# Patient Record
Sex: Female | Born: 1995
Health system: Southern US, Community
[De-identification: ages and names within clinical notes are randomized; demographics above are authoritative.]

## PROBLEM LIST (undated history)

## (undated) DIAGNOSIS — G5600 Carpal tunnel syndrome, unspecified upper limb: Secondary | ICD-10-CM

## (undated) HISTORY — DX: Carpal tunnel syndrome, unspecified upper limb: G56.00

---

## 2019-08-21 ENCOUNTER — Other Ambulatory Visit: Payer: Self-pay

## 2019-08-22 ENCOUNTER — Other Ambulatory Visit: Payer: Self-pay

## 2019-08-22 ENCOUNTER — Ambulatory Visit (HOSPITAL_BASED_OUTPATIENT_CLINIC_OR_DEPARTMENT_OTHER)
Admission: RE | Admit: 2019-08-22 | Discharge: 2019-08-22 | Disposition: A | Discharge status: Home / Self Care | Payer: 59 | Source: Ambulatory Visit | Attending: Medical | Admitting: Medical

## 2019-08-22 ENCOUNTER — Ambulatory Visit: Payer: 59 | Admitting: Medical

## 2019-08-22 ENCOUNTER — Encounter: Payer: Self-pay | Admitting: Medical

## 2019-08-22 VITALS — BP 122/70 | HR 90 | Temp 98.0°F | Resp 18 | Ht 65.0 in | Wt 183.8 lb

## 2019-08-22 DIAGNOSIS — M25539 Pain in unspecified wrist: Secondary | ICD-10-CM | POA: Insufficient documentation

## 2019-08-22 MED ORDER — DICLOFENAC SODIUM 75 MG PO TBEC: 75.0000 mg | DELAYED_RELEASE_TABLET | Freq: Two times a day (BID) | ORAL | 0 refills | Status: DC

## 2019-08-22 NOTE — Progress Notes (Signed)
Subjective:    Patient ID: Elizabeth Liu, female    DOB: [birth year], 24 y.o.   MRN: [id number]  HPI   Pt in for first time.   Pt works at [employer]. Pt is preschool Primary school teacher. Pt states no exercise regularly. Pt states moderate healthy. She has been eating more fruits. Nonsmoker, no alcohol use. Married- one child 53 years old.  Pt states she has had some pains in hand/wrist. Pt states 2 months ago or so went to UC and they told her probable carpel tunnel syndrome. Pt has had on and off pain for 5-6 years. Most recent some pain more over past month. Pain 2 days out of week. More pain when she types or does fine motor skill work with preschoolers.Pt does not have any tingling or numbess to hand. Pt describes pain more ventral surface of wrist when has pain. More on left side. No neck pain.   No diffuse arthralgias.   At urgent care gave a medications that she took for a week.   [facility]  LMP- Jul 29, 2019   Review of Systems  Constitutional: Negative for chills, fatigue and fever.  Respiratory: Negative for cough, chest tightness, shortness of breath and wheezing.   Cardiovascular: Negative for chest pain and palpitations.  Gastrointestinal: Negative for abdominal pain.  Musculoskeletal:       Bilateral wrist pains.  Skin: Negative for rash.  Neurological: Negative for dizziness, syncope and headaches.  Hematological: Negative for adenopathy. Does not bruise/bleed easily.  Psychiatric/Behavioral: Negative for behavioral problems, decreased concentration, dysphoric mood, sleep disturbance and suicidal ideas. The patient is not nervous/anxious.     Past Medical History:  Diagnosis Date  . Carpal tunnel syndrome      Social History   Socioeconomic History  . Marital status: Married    Spouse name: Not on file  . Number of children: Not on file  . Years of education: Not on file  . Highest education level: Not on file  Occupational History   . Not on file  Tobacco Use  . Smoking status: Never Smoker  . Smokeless tobacco: Never Used  Substance and Sexual Activity  . Alcohol use: Never  . Drug use: Never  . Sexual activity: Yes    Birth control/protection: None  Other Topics Concern  . Not on file  Social History Narrative  . Not on file   Social Determinants of Health   Financial Resource Strain:   . Difficulty of Paying Living Expenses: Not on file  Food Insecurity:   . Worried About Programme researcher, broadcasting/film/video in the Last Year: Not on file  . Ran Out of Food in the Last Year: Not on file  Transportation Needs:   . Lack of Transportation (Medical): Not on file  . Lack of Transportation (Non-Medical): Not on file  Physical Activity:   . Days of Exercise per Week: Not on file  . Minutes of Exercise per Session: Not on file  Stress:   . Feeling of Stress : Not on file  Social Connections:   . Frequency of Communication with Friends and Family: Not on file  . Frequency of Social Gatherings with Friends and Family: Not on file  . Attends Religious Services: Not on file  . Active Member of Clubs or Organizations: Not on file  . Attends Banker Meetings: Not on file  . Marital Status: Not on file  Intimate Partner Violence:   .  Fear of Current or Ex-Partner: Not on file  . Emotionally Abused: Not on file  . Physically Abused: Not on file  . Sexually Abused: Not on file    History reviewed. No pertinent surgical history.  Family History  Problem Relation Age of Onset  . Diabetes Mother   . Cancer Paternal Grandmother     Not on File  No current outpatient medications on file prior to visit.   No current facility-administered medications on file prior to visit.    BP 122/70 (BP Location: Left Arm, Patient Position: Sitting, Cuff Size: Normal)   Pulse 90   Temp 98 F (36.7 C) (Temporal)   Resp 18   Ht 5\' 5"  (1.651 m)   Wt 183 lb 12.8 oz (83.4 kg)   LMP 07/29/2019 (Approximate)   SpO2 99%    BMI 30.59 kg/m       Objective:   Physical Exam  General- No acute distress. Pleasant patient. Neck- Full range of motion, no jvd Lungs- Clear, even and unlabored. Heart- regular rate and rhythm. Neurologic- CNII- XII grossly intact. Upper ext no swelling of either wrist. No pain on palpation. Good rom. Normal grip strength. Negative phalens sign.        Assessment & Plan:    Your bilateral wrist pain will order xray of left side since this is side that is worse.  Possible CTS but not clear as no numbness or tingling. Rx diclofenac and use wrist cock up splint at least every night.  Give me update on how you are doing in 10 days. My chart would work. Then will decide if need to refer to hand  Specialist or sports medicine.  30 minutes spent with new pt.   Mackie Pai, PA-C

## 2019-08-22 NOTE — Patient Instructions (Signed)
Your bilateral wrist pain will order xray of left side since this is side that is worse.  Possible CTS but not clear as no numbness or tingling. Rx diclofenac and use wrist cock up splint at least every night.  Give me update on how you are doing in 10 days. My chart would work. Then will decide if need to refer to hand  Specialist or sports medicine.

## 2020-02-15 ENCOUNTER — Emergency Department (HOSPITAL_BASED_OUTPATIENT_CLINIC_OR_DEPARTMENT_OTHER): Payer: 59

## 2020-02-15 ENCOUNTER — Emergency Department (HOSPITAL_BASED_OUTPATIENT_CLINIC_OR_DEPARTMENT_OTHER)
Admission: EM | Admit: 2020-02-15 | Discharge: 2020-02-15 | Disposition: A | Discharge status: Home / Self Care | Payer: 59 | Attending: Emergency Medicine | Admitting: Emergency Medicine

## 2020-02-15 ENCOUNTER — Encounter (HOSPITAL_BASED_OUTPATIENT_CLINIC_OR_DEPARTMENT_OTHER): Payer: Self-pay | Admitting: *Deleted

## 2020-02-15 DIAGNOSIS — R531 Weakness: Secondary | ICD-10-CM | POA: Insufficient documentation

## 2020-02-15 DIAGNOSIS — Z20822 Contact with and (suspected) exposure to covid-19: Secondary | ICD-10-CM | POA: Insufficient documentation

## 2020-02-15 DIAGNOSIS — R112 Nausea with vomiting, unspecified: Secondary | ICD-10-CM | POA: Insufficient documentation

## 2020-02-15 DIAGNOSIS — R059 Cough, unspecified: Secondary | ICD-10-CM

## 2020-02-15 DIAGNOSIS — R05 Cough: Secondary | ICD-10-CM | POA: Insufficient documentation

## 2020-02-15 DIAGNOSIS — R197 Diarrhea, unspecified: Secondary | ICD-10-CM | POA: Diagnosis not present

## 2020-02-15 LAB — CBC WITH DIFFERENTIAL/PLATELET
Abs Immature Granulocytes: 0.03 10*3/uL (ref 0.00–0.07)
Basophils Absolute: 0 10*3/uL (ref 0.0–0.1)
Basophils Relative: 1 %
Eosinophils Absolute: 0.2 10*3/uL (ref 0.0–0.5)
Eosinophils Relative: 3 %
HCT: 42.8 % (ref 36.0–46.0)
Hemoglobin: 14.5 g/dL (ref 12.0–15.0)
Immature Granulocytes: 0 %
Lymphocytes Relative: 23 %
Lymphs Abs: 2 10*3/uL (ref 0.7–4.0)
MCH: 31 pg (ref 26.0–34.0)
MCHC: 33.9 g/dL (ref 30.0–36.0)
MCV: 91.6 fL (ref 80.0–100.0)
Monocytes Absolute: 0.7 10*3/uL (ref 0.1–1.0)
Monocytes Relative: 8 %
Neutro Abs: 5.6 10*3/uL (ref 1.7–7.7)
Neutrophils Relative %: 65 %
Platelets: 291 10*3/uL (ref 150–400)
RBC: 4.67 MIL/uL (ref 3.87–5.11)
RDW: 12.6 % (ref 11.5–15.5)
WBC: 8.7 10*3/uL (ref 4.0–10.5)
nRBC: 0 % (ref 0.0–0.2)

## 2020-02-15 LAB — COMPREHENSIVE METABOLIC PANEL
ALT: 12 U/L (ref 0–44)
AST: 17 U/L (ref 15–41)
Albumin: 4.2 g/dL (ref 3.5–5.0)
Alkaline Phosphatase: 63 U/L (ref 38–126)
Anion gap: 11 (ref 5–15)
BUN: 10 mg/dL (ref 6–20)
CO2: 23 mmol/L (ref 22–32)
Calcium: 9 mg/dL (ref 8.9–10.3)
Chloride: 103 mmol/L (ref 98–111)
Creatinine, Ser: 0.77 mg/dL (ref 0.44–1.00)
GFR calc Af Amer: >60 mL/min (ref >60)
GFR calc non Af Amer: >60 mL/min (ref >60)
Glucose, Bld: 128 mg/dL — ABNORMAL HIGH (ref 70–99)
Potassium: 3.5 mmol/L (ref 3.5–5.1)
Sodium: 137 mmol/L (ref 135–145)
Total Bilirubin: 0.6 mg/dL (ref 0.3–1.2)
Total Protein: 8.2 g/dL — ABNORMAL HIGH (ref 6.5–8.1)

## 2020-02-15 LAB — URINALYSIS, MICROSCOPIC (REFLEX): RBC / HPF: >50 RBC/hpf (ref 0–5)

## 2020-02-15 LAB — URINALYSIS, ROUTINE W REFLEX MICROSCOPIC
Bilirubin Urine: NEGATIVE
Glucose, UA: NEGATIVE mg/dL
Ketones, ur: >80 mg/dL — AB
Leukocytes,Ua: NEGATIVE
Nitrite: NEGATIVE
Protein, ur: 100 mg/dL — AB
Specific Gravity, Urine: 1.025 (ref 1.005–1.030)
pH: 6.5 (ref 5.0–8.0)

## 2020-02-15 LAB — PREGNANCY, URINE: Preg Test, Ur: NEGATIVE

## 2020-02-15 LAB — SARS CORONAVIRUS 2 BY RT PCR (HOSPITAL ORDER, PERFORMED IN ~~LOC~~ HOSPITAL LAB): SARS Coronavirus 2: NEGATIVE

## 2020-02-15 MED ORDER — SODIUM CHLORIDE 0.9 % IV BOLUS
1000.0000 mL | Freq: Once | INTRAVENOUS | Status: AC
  Administered 2020-02-15: 1000 mL via INTRAVENOUS

## 2020-02-15 MED ORDER — BENZONATATE 100 MG PO CAPS
100.0000 mg | ORAL_CAPSULE | Freq: Three times a day (TID) | ORAL | 0 refills | Status: DC
Start: 2020-02-15 — End: 2020-08-01

## 2020-02-15 MED ORDER — ONDANSETRON HCL 4 MG/2ML IJ SOLN
4.0000 mg | Freq: Once | INTRAMUSCULAR | Status: AC
  Administered 2020-02-15: 4 mg via INTRAVENOUS
  Filled 2020-02-15: qty 2

## 2020-02-15 MED ORDER — ONDANSETRON 4 MG PO TBDP
4.0000 mg | ORAL_TABLET | Freq: Three times a day (TID) | ORAL | 0 refills | Status: DC | PRN
Start: 2020-02-15 — End: 2020-08-01

## 2020-02-15 NOTE — ED Triage Notes (Signed)
C/o n/v/d , bodyaches , fever generalized weakness and cough x 1 day

## 2020-02-15 NOTE — ED Notes (Signed)
Pt passed PO challenge.

## 2020-02-15 NOTE — Discharge Instructions (Addendum)
As discussed, all of your labs were reassuring today.  I suspect her symptoms are related to a viral infection.  Your Covid test is negative.  I am sending you home with nausea medication and cough medication.  Take as needed and as prescribed.  Please follow-up with PCP if symptoms not improved within the next week.  Return to the ER for new or worsening symptoms.

## 2020-02-15 NOTE — ED Notes (Signed)
PO challenge given  

## 2020-02-15 NOTE — ED Provider Notes (Signed)
[facility] EMERGENCY DEPARTMENT Provider Note   CSN: [id number] Arrival date & time: 02/15/20  1721     History Chief Complaint  Patient presents with  . Emesis    Elizabeth Liu is a 24 y.o. female with no significant past medical history who presents to the ED due to numerous complaints of nausea, vomiting, diarrhea, body aches, and generalized weakness. Patient states symptoms started roughly 1 day ago. Patient admits to numerous episodes of nonbloody, nonbilious emesis after any p.o. intake. Admits to one episode of non-bloody diarrhea while waiting in the waiting room. She also admits to a dry cough that started earlier this morning. Denies sick contacts or known Covid exposures. She is not received her Covid vaccine. She states she first thought her symptoms were related to allergies however symptoms began to worsen over the past 24 hours. Denies abdominal pain, urinary and vaginal symptoms. Is not tried anything for symptoms prior to arrival. No aggravating or alleviating factors. Denies chest pain and shortness of breath.  History obtained from patient and past medical records. No interpreter used during encounter.      Past Medical History:  Diagnosis Date  . Carpal tunnel syndrome     There are no problems to display for this patient.   History reviewed. No pertinent surgical history.   OB History   No obstetric history on file.     Family History  Problem Relation Age of Onset  . Diabetes Mother   . Cancer Paternal Grandmother     Social History   Tobacco Use  . Smoking status: Never Smoker  . Smokeless tobacco: Never Used  Vaping Use  . Vaping Use: Never used  Substance Use Topics  . Alcohol use: Never  . Drug use: Never    Home Medications Prior to Admission medications   Medication Sig Start Date End Date Taking? Authorizing Provider  benzonatate (TESSALON) 100 MG capsule Take 1 capsule (100 mg total) by mouth every 8 (eight) hours.  02/15/20   Mannie Stabile, PA-C  diclofenac (VOLTAREN) 75 MG EC tablet Take 1 tablet (75 mg total) by mouth 2 (two) times daily. 08/22/19   Saguier, Ramon Dredge, PA-C  ondansetron (ZOFRAN ODT) 4 MG disintegrating tablet Take 1 tablet (4 mg total) by mouth every 8 (eight) hours as needed for nausea or vomiting. 02/15/20   Mannie Stabile, PA-C    Allergies    Patient has no known allergies.  Review of Systems   Review of Systems  Constitutional: Negative for chills and fever.  Respiratory: Positive for cough. Negative for shortness of breath.   Cardiovascular: Negative for chest pain.  Gastrointestinal: Positive for diarrhea, nausea and vomiting. Negative for abdominal pain.  Genitourinary: Negative for dysuria and vaginal discharge.  Neurological: Positive for weakness (generalized).  All other systems reviewed and are negative.   Physical Exam Updated Vital Signs BP 93/65   Pulse 61   Temp 98.2 F (36.8 C) (Oral)   Resp 16   Ht 5\' 5"  (1.651 m)   Wt 81.6 kg   LMP 02/10/2020   SpO2 100%   BMI 29.95 kg/m   Physical Exam Vitals and nursing note reviewed.  Constitutional:      General: She is not in acute distress.    Appearance: She is not ill-appearing.  HENT:     Head: Normocephalic.  Eyes:     Pupils: Pupils are equal, round, and reactive to light.  Neck:     Comments: No meningismus.  Cardiovascular:     Rate and Rhythm: Normal rate and regular rhythm.     Pulses: Normal pulses.     Heart sounds: Normal heart sounds. No murmur heard.  No friction rub. No gallop.   Pulmonary:     Effort: Pulmonary effort is normal.     Breath sounds: Normal breath sounds.     Comments: Coughing on exam. Respirations equal and unlabored, patient able to speak in full sentences, lungs clear to auscultation bilaterally Abdominal:     General: Abdomen is flat. Bowel sounds are normal. There is no distension.     Palpations: Abdomen is soft.     Tenderness: There is no abdominal  tenderness. There is no guarding or rebound.     Comments: Abdomen soft, nondistended, nontender to palpation in all quadrants without guarding or peritoneal signs. No rebound.   Musculoskeletal:     Cervical back: Neck supple.     Comments: Able to move all 4 extremities without difficulty.   Skin:    General: Skin is warm and dry.  Neurological:     General: No focal deficit present.     Mental Status: She is alert.  Psychiatric:        Mood and Affect: Mood normal.        Behavior: Behavior normal.     ED Results / Procedures / Treatments   Labs (all labs ordered are listed, but only abnormal results are displayed) Labs Reviewed  URINALYSIS, ROUTINE W REFLEX MICROSCOPIC - Abnormal; Notable for the following components:      Result Value   APPearance CLOUDY (*)    Hgb urine dipstick LARGE (*)    Ketones, ur >80 (*)    Protein, ur 100 (*)    All other components within normal limits  COMPREHENSIVE METABOLIC PANEL - Abnormal; Notable for the following components:   Glucose, Bld 128 (*)    Total Protein 8.2 (*)    All other components within normal limits  URINALYSIS, MICROSCOPIC (REFLEX) - Abnormal; Notable for the following components:   Bacteria, UA MANY (*)    All other components within normal limits  SARS CORONAVIRUS 2 BY RT PCR (HOSPITAL ORDER, PERFORMED IN [facility])  PREGNANCY, URINE  CBC WITH DIFFERENTIAL/PLATELET    EKG None  Radiology DG Chest Portable 1 View  Result Date: 02/15/2020 CLINICAL DATA:  Cough, fever, congestion EXAM: PORTABLE CHEST 1 VIEW COMPARISON:  None. FINDINGS: The heart size and mediastinal contours are within normal limits. Both lungs are clear. The visualized skeletal structures are unremarkable. IMPRESSION: No active disease. Electronically Signed   By: Sharlet Salina M.D.   On: 02/15/2020 18:42    Procedures Procedures (including critical care time)  Medications Ordered in ED Medications  sodium chloride 0.9 %  bolus 1,000 mL (0 mLs Intravenous Stopped 02/15/20 2154)  ondansetron (ZOFRAN) injection 4 mg (4 mg Intravenous Given 02/15/20 2006)    ED Course  I have reviewed the triage vital signs and the nursing notes.  Pertinent labs & imaging results that were available during my care of the patient were reviewed by me and considered in my medical decision making (see chart for details).  Clinical Course as of Feb 15 2216  Fri Feb 15, 2020  1944 SARS Coronavirus 2: NEGATIVE [CA]  1944 Preg Test, Ur: NEGATIVE [CA]  1944 Hgb urine dipstick(!): LARGE [CA]  1944 Bacteria, UA(!): MANY [CA]  1944 Squamous Epithelial / LPF: 11-20 [CA]    Clinical Course  User Index [CA] Jesusita Oka   MDM Rules/Calculators/A&P                         24 year old female presents to the ED due to nausea, vomiting, diarrhea, body aches, generalized weakness for the past day. She also admits to a dry cough. Denies sick contacts and known Covid exposures. Upon arrival, patient afebrile, not tachycardic or hypoxic. Patient nontoxic-appearing. Physical exam reassuring. Lungs clear to auscultation bilaterally. Abdomen soft, nondistended, nontender. Low suspicion for acute abdomen at this time. Routine labs ordered at triage. Will give IV fluids and Zofran for symptomatic relief and reassess patient. Suspect symptoms related to a viral etiology. Low suspicion for bacterial infection. No meningismus to suggest meningitis.  CBC unremarkable with no leukocytosis and normal hemoglobin. CMP significant for mild hyperglycemia at 120 with no anion gap. Normal renal function. No electrolyte derangements. UA significant for large hematuria likely due to menstrual cycle. Ketonuria and proteinuria likely due to dehydration. No signs of infection. Negative pregnancy test. Negative Covid test.  Chest x-ray personally reviewed which is negative for signs of pneumonia, pneumothorax, or widened mediastinum.  10:14 PM reassessed patient  after IVFs and zofran. Patient notes improvement in symptoms and is ready to go home. Patient able to tolerate po without difficulty. Patient discharged with zofran and cough medication. Suspect symptoms related to a viral infection. Strict ED precautions discussed with patient. Patient states understanding and agrees to plan. Patient discharged home in no acute distress and stable vitals Final Clinical Impression(s) / ED Diagnoses Final diagnoses:  Nausea vomiting and diarrhea  Cough    Rx / DC Orders ED Discharge Orders         Ordered    ondansetron (ZOFRAN ODT) 4 MG disintegrating tablet  Every 8 hours PRN     Discontinue  Reprint     02/15/20 2216    benzonatate (TESSALON) 100 MG capsule  Every 8 hours     Discontinue  Reprint     02/15/20 2216           Mannie Stabile, PA-C 02/15/20 2220    Melene Plan, DO 02/15/20 2300

## 2020-07-31 ENCOUNTER — Other Ambulatory Visit: Payer: Self-pay

## 2020-07-31 ENCOUNTER — Encounter (HOSPITAL_BASED_OUTPATIENT_CLINIC_OR_DEPARTMENT_OTHER): Payer: Self-pay

## 2020-07-31 DIAGNOSIS — U071 COVID-19: Secondary | ICD-10-CM | POA: Diagnosis not present

## 2020-07-31 DIAGNOSIS — R059 Cough, unspecified: Secondary | ICD-10-CM | POA: Diagnosis present

## 2020-07-31 NOTE — ED Triage Notes (Signed)
Pt c/o flu like sx x 3 days-states she tested neg covid yesterday with +covid family members-NAD-steady gait

## 2020-08-01 ENCOUNTER — Emergency Department (HOSPITAL_BASED_OUTPATIENT_CLINIC_OR_DEPARTMENT_OTHER)
Admission: EM | Admit: 2020-08-01 | Discharge: 2020-08-01 | Disposition: A | Discharge status: Home / Self Care | Payer: Commercial Managed Care - PPO | Attending: Emergency Medicine | Admitting: Emergency Medicine

## 2020-08-01 DIAGNOSIS — J069 Acute upper respiratory infection, unspecified: Secondary | ICD-10-CM

## 2020-08-01 LAB — SARS CORONAVIRUS 2 (TAT 6-24 HRS): SARS Coronavirus 2: POSITIVE — AB

## 2020-08-01 LAB — RAPID INFLUENZA A&B ANTIGENS
Influenza A (ARMC): NEGATIVE
Influenza B (ARMC): NEGATIVE

## 2020-08-01 NOTE — ED Notes (Signed)
Discharge instructions discussed with patient. Verbalized understanding. Departs ED at this time in stable condition.  

## 2020-08-01 NOTE — ED Provider Notes (Signed)
[facility] Provider Note: [name], MD, FACEP  CSN: [id number] MRN: [id number] ARRIVAL: 07/31/20 at 2158 ROOM: [facility]   CHIEF COMPLAINT  Cough   HISTORY OF PRESENT ILLNESS  08/01/20 5:27 AM Elizabeth Liu is a 24 y.o. female who is about [redacted] weeks pregnant.  She has had flulike symptoms for the past 4-5 days.  Specifically she has had a cough, body aches, fever (to 99.9 here), nausea and vomiting.  2 days ago she tested negative for Covid despite known Covid in family members.  Her most significant symptom is cough which was fairly severe earlier in the week but is improving.  She has been avoiding over-the-counter medications due to her pregnancy.  She describes her generalized body aches as a 6 out of 10.   Past Medical History:  Diagnosis Date  . Carpal tunnel syndrome     History reviewed. No pertinent surgical history.  Family History  Problem Relation Age of Onset  . Diabetes Mother   . Cancer Paternal Grandmother     Social History   Tobacco Use  . Smoking status: Never Smoker  . Smokeless tobacco: Never Used  Vaping Use  . Vaping Use: Never used  Substance Use Topics  . Alcohol use: Never  . Drug use: Never    Prior to Admission medications   Medication Sig Start Date End Date Taking? Authorizing Provider  benzonatate (TESSALON) 100 MG capsule Take 1 capsule (100 mg total) by mouth every 8 (eight) hours. 02/15/20   Mannie Stabile, PA-C  diclofenac (VOLTAREN) 75 MG EC tablet Take 1 tablet (75 mg total) by mouth 2 (two) times daily. 08/22/19   Saguier, Ramon Dredge, PA-C  ondansetron (ZOFRAN ODT) 4 MG disintegrating tablet Take 1 tablet (4 mg total) by mouth every 8 (eight) hours as needed for nausea or vomiting. 02/15/20   Mannie Stabile, PA-C    Allergies Patient has no known allergies.   REVIEW OF SYSTEMS  Negative except as noted here or in the History of Present Illness.   PHYSICAL EXAMINATION  Initial Vital Signs Blood  pressure 96/70, pulse (!) 102, temperature 98.9 F (37.2 C), temperature source Oral, resp. rate 14, height 5\' 5"  (1.651 m), weight 72.6 kg, SpO2 97 %.  Examination General: Well-developed, well-nourished female in no acute distress; appearance consistent with age of record HENT: normocephalic; atraumatic; mild pharyngeal erythema Eyes: pupils equal, round and reactive to light; extraocular muscles intact Neck: supple Heart: regular rate and rhythm Lungs: clear to auscultation bilaterally; persistent dry cough Abdomen: soft; nondistended; nontender; bowel sounds present Extremities: No deformity; full range of motion; pulses normal Neurologic: Awake, alert and oriented; motor function intact in all extremities and symmetric; no facial droop Skin: Warm and dry Psychiatric: Normal mood and affect   RESULTS  Summary of this visit's results, reviewed and interpreted by myself:   EKG Interpretation  Date/Time:    Ventricular Rate:    PR Interval:    QRS Duration:   QT Interval:    QTC Calculation:   R Axis:     Text Interpretation:        Laboratory Studies: No results found for this or any previous visit (from the past 24 hour(s)). Imaging Studies: No results found.  ED COURSE and MDM  Nursing notes, initial and subsequent vitals signs, including pulse oximetry, reviewed and interpreted by myself.  Vitals:   07/31/20 2206 08/01/20 0108 08/01/20 0330 08/01/20 0526  BP: (!) 106/93 103/72 96/70 95/68   Pulse: 08/03/20)  125 (!) 118 (!) 102 88  Resp: 20 18 14 16   Temp: 99.9 F (37.7 C)  98.9 F (37.2 C)   TempSrc: Oral  Oral   SpO2: 96% 96% 97% 98%  Weight: 72.6 kg     Height: 5\' 5"  (1.651 m)      Medications - No data to display  The patient's Covid test may have been a false negative but she can also have influenza A which has been seen frequently in the ED recently.  We will test her for both.  PROCEDURES  Procedures   ED DIAGNOSES     ICD-10-CM   1. Viral URI with  cough  J06.9        Shaquina Gillham, MD 08/01/20 [phone]

## 2021-01-13 IMAGING — DX DG CHEST 1V PORT
1 series · 1 of 1 positions shown · non-contrast
Comparison: None.

CLINICAL DATA: Cough, fever, congestion

EXAM:
PORTABLE CHEST 1 VIEW

[chest ap]
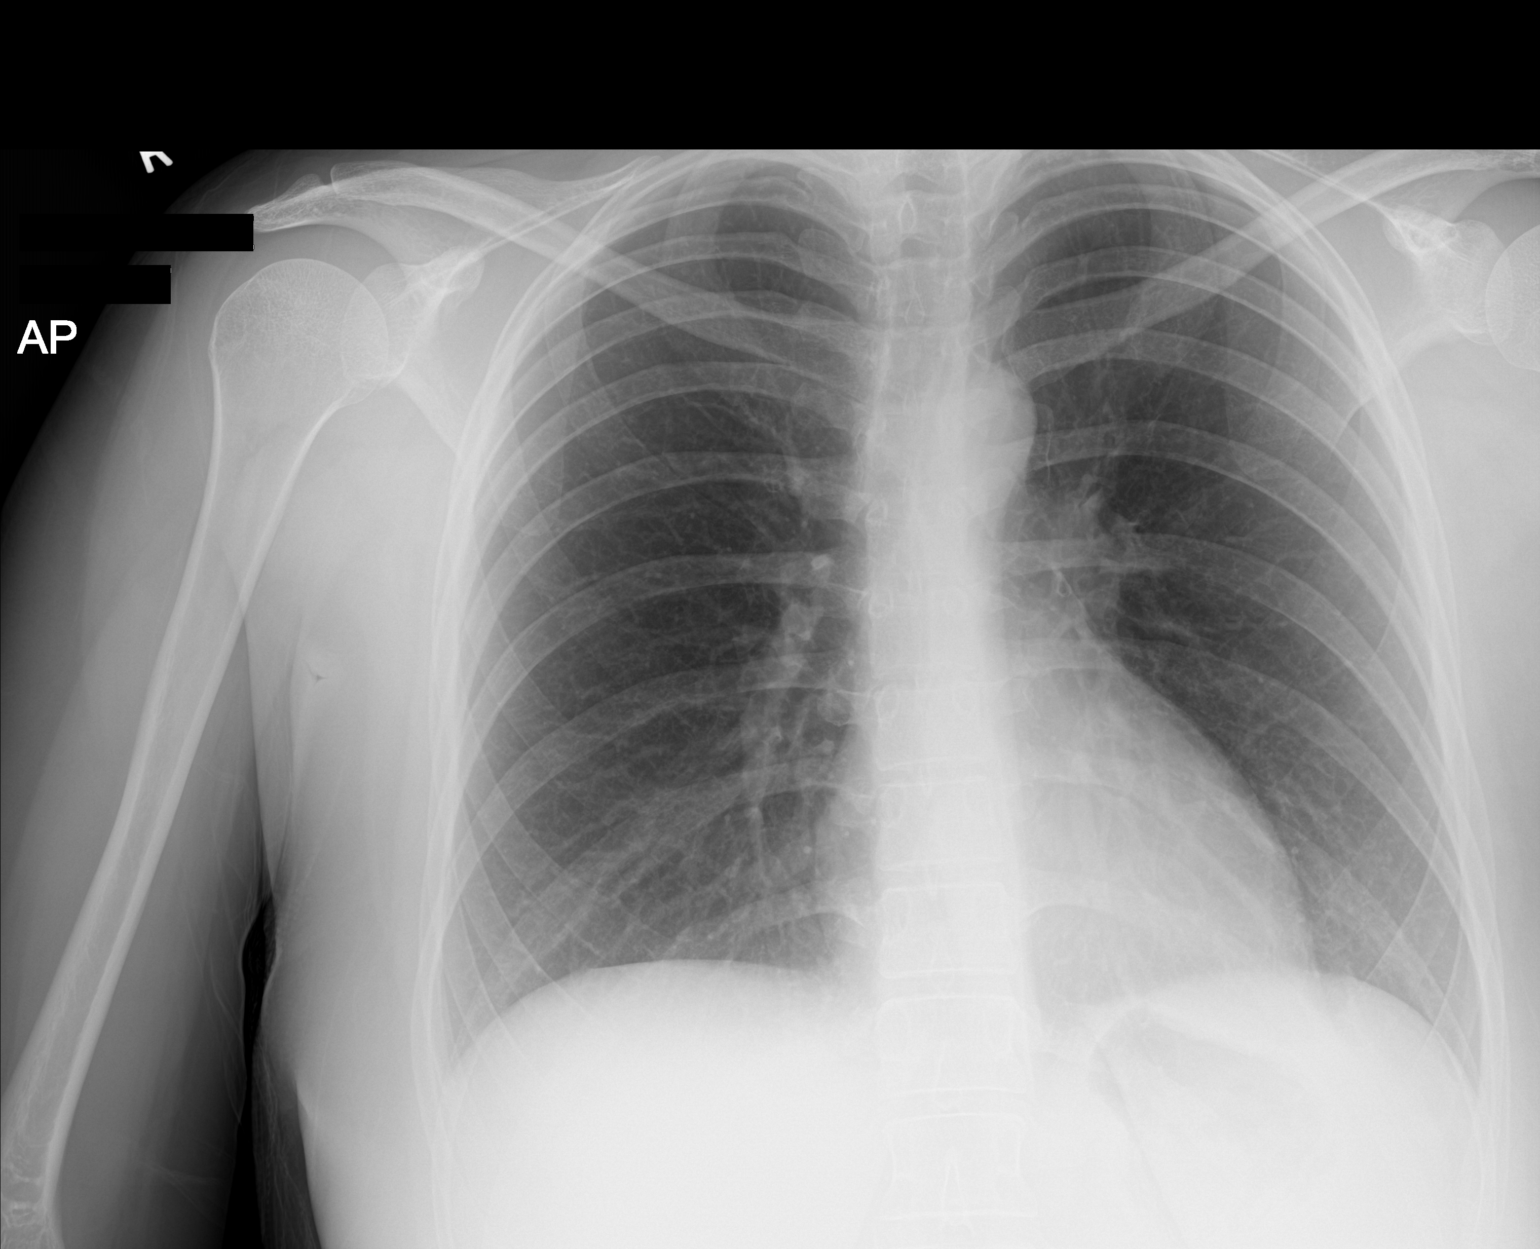

[1 of 1 positions shown; findings below may reference images not displayed]

FINDINGS: The heart size and mediastinal contours are within normal limits.
Both lungs are clear. The visualized skeletal structures are
unremarkable.
IMPRESSION: No active disease.

## 2024-10-24 ENCOUNTER — Ambulatory Visit: Admitting: Family Medicine
